# Patient Record
Sex: Male | Born: 1985 | Race: White | Hispanic: No | Marital: Married | Smoking: Former smoker
Health system: Southern US, Community
[De-identification: ages and names within clinical notes are randomized; demographics above are authoritative.]

## PROBLEM LIST (undated history)

## (undated) DIAGNOSIS — J45909 Unspecified asthma, uncomplicated: Secondary | ICD-10-CM

## (undated) DIAGNOSIS — H1010 Acute atopic conjunctivitis, unspecified eye: Secondary | ICD-10-CM

## (undated) DIAGNOSIS — J309 Allergic rhinitis, unspecified: Secondary | ICD-10-CM

## (undated) HISTORY — PX: ANTERIOR CRUCIATE LIGAMENT REPAIR: SHX115

## (undated) HISTORY — PX: WRIST SURGERY: SHX841

## (undated) HISTORY — PX: APPENDECTOMY: SHX54

## (undated) HISTORY — PX: KNEE SURGERY: SHX244

## (undated) HISTORY — DX: Allergic rhinitis, unspecified: J30.9

## (undated) HISTORY — DX: Acute atopic conjunctivitis, unspecified eye: H10.10

## (undated) HISTORY — DX: Unspecified asthma, uncomplicated: J45.909

---

## 2015-01-04 ENCOUNTER — Telehealth: Payer: Self-pay | Admitting: Pediatrics

## 2015-01-04 NOTE — Telephone Encounter (Signed)
Pt wants to change medication from [employer] Respi-click to Pro-Air HFA. Please return call to patient or call in to Pharmacy if okay.

## 2015-01-05 ENCOUNTER — Other Ambulatory Visit: Payer: Self-pay | Admitting: Internal Medicine

## 2015-01-05 MED ORDER — PROAIR HFA 108 (90 BASE) MCG/ACT IN AERS: 2.0000 | INHALATION_SPRAY | RESPIRATORY_TRACT | Status: DC | PRN

## 2015-01-05 NOTE — Telephone Encounter (Signed)
Spoke with pt and let him know that his refill for [employer] HFA had been called in to his pharmacy at [facility] on [address] in [place].

## 2015-01-05 NOTE — Telephone Encounter (Signed)
OK to change to [facility]

## 2015-01-05 NOTE — Telephone Encounter (Signed)
Pt requesting medication change from [employer] Respiclick to [employer] HFA?

## 2015-02-06 ENCOUNTER — Ambulatory Visit (INDEPENDENT_AMBULATORY_CARE_PROVIDER_SITE_OTHER): Payer: BLUE CROSS/BLUE SHIELD | Admitting: Pediatrics

## 2015-02-06 ENCOUNTER — Encounter: Payer: Self-pay | Admitting: Pediatrics

## 2015-02-06 VITALS — BP 118/74 | HR 78 | Temp 97.7°F | Resp 16 | Ht 71.0 in | Wt 195.5 lb

## 2015-02-06 DIAGNOSIS — J4530 Mild persistent asthma, uncomplicated: Secondary | ICD-10-CM | POA: Diagnosis not present

## 2015-02-06 DIAGNOSIS — J3089 Other allergic rhinitis: Secondary | ICD-10-CM | POA: Diagnosis not present

## 2015-02-06 MED ORDER — MONTELUKAST SODIUM 10 MG PO TABS: 10.0000 mg | ORAL_TABLET | Freq: Every day | ORAL | Status: AC

## 2015-02-06 MED ORDER — ALBUTEROL SULFATE HFA 108 (90 BASE) MCG/ACT IN AERS: 2.0000 | INHALATION_SPRAY | RESPIRATORY_TRACT | Status: DC | PRN

## 2015-02-06 NOTE — Progress Notes (Signed)
  [address] [place] [address] Dept: [phone]  FOLLOW UP NOTE  Patient ID: Alexander Barber, male    DOB: 10/10/1985  Age: 29 y.o. MRN: [id number] Date of Office Visit: 02/06/2015  Assessment Chief Complaint: Follow-up  HPI Alexander Barber presents for follow-up of his asthma and allergies. He has noted aggravation of his symptoms on exposure to dog and also mowing the lawn and raking leaves. He has had a flu vaccination. He is very allergic to ragweed ,grass pollens, tree pollens, molds and dust mite. He has a slight reaction to dog   Current medications are Pro-air 2 puffs every 4 hours if needed, cetirizine 10 mg once a day, fluticasone 2 sprays per nostril once a day and montelukast 10 mg once a day  Drug Allergies:  No Known Allergies  Physical Exam: BP 118/74 mmHg  Pulse 78  Temp(Src) 97.7 F (36.5 C) (Oral)  Resp 16  Ht 5\' 11"  (1.803 m)  Wt 195 lb 8.8 oz (88.7 kg)  BMI 27.29 kg/m2   Physical Exam  Constitutional: He is oriented to person, place, and time. He appears well-developed and well-nourished.  HENT:  Eyes normal. Ears normal. Nose moderate swelling of nasal turbinates. Pharynx normal.  Neck: Neck supple.  Cardiovascular:  S1 and S2 normal no murmurs  Pulmonary/Chest:  Clear to percussion and auscultation  Lymphadenopathy:    He has no cervical adenopathy.  Neurological: He is alert and oriented to person, place, and time.  Psychiatric: He has a normal mood and affect. His behavior is normal. Judgment and thought content normal.    Diagnostics:  FVC 6.37 L FEV1 4.09 L predicted FVC 5.61 L predicted FEV1 4.55 L-this shows a mild reduction in the FEV1 percent  Assessment and Plan: 1. Mild persistent asthma, uncomplicated   2. Other allergic rhinitis     Meds ordered this encounter  Medications  . montelukast (SINGULAIR) 10 MG tablet    Sig: Take 1 tablet (10 mg total) by mouth at bedtime.    Dispense:  30 tablet    Refill:  5  .  albuterol (PROAIR HFA) 108 (90 BASE) MCG/ACT inhaler    Sig: Inhale 2 puffs into the lungs every 4 (four) hours as needed for wheezing or shortness of breath.    Dispense:  1 Inhaler    Refill:  3    Patient Instructions  Continue on the treatment plan outlined above Because of the severity of his symptoms from the ragweed allergy, I added prednisone 20 mg twice a day for 3 days, 20 mg on day 4, 10 mg on day 5.  I gave him information regarding allergy injections. He is a very allergic individual. He will let me know if he wants to go ahead and start on allergy injections    Return in about 1 year (around 02/06/2016).    Thank you for the opportunity to care for this patient.  Please do not hesitate to contact me with questions.  Tonette BihariJ. A. Bardelas, M.D.  [facility] [address] [place] [address] [phone]

## 2015-02-06 NOTE — Patient Instructions (Addendum)
Continue on the treatment plan outlined above Because of the severity of his symptoms from the ragweed allergy, I added prednisone 20 mg twice a day for 3 days, 20 mg on day 4, 10 mg on day 5.  I gave him information regarding allergy injections. He is a very allergic individual. He will let me know if he wants to go ahead and start on allergy injections

## 2015-05-10 ENCOUNTER — Other Ambulatory Visit: Payer: Self-pay | Admitting: Allergy

## 2015-05-10 MED ORDER — ALBUTEROL SULFATE HFA 108 (90 BASE) MCG/ACT IN AERS: 2.0000 | INHALATION_SPRAY | RESPIRATORY_TRACT | Status: DC | PRN

## 2015-05-11 ENCOUNTER — Other Ambulatory Visit: Payer: Self-pay

## 2015-05-11 MED ORDER — ALBUTEROL SULFATE HFA 108 (90 BASE) MCG/ACT IN AERS: 2.0000 | INHALATION_SPRAY | RESPIRATORY_TRACT | Status: DC | PRN

## 2015-07-19 ENCOUNTER — Other Ambulatory Visit: Payer: Self-pay | Admitting: Pediatrics

## 2015-07-19 NOTE — Telephone Encounter (Signed)
rx sent for proair hfa with 1 refill

## 2016-06-23 ENCOUNTER — Other Ambulatory Visit: Payer: Self-pay | Admitting: Allergy

## 2016-07-03 ENCOUNTER — Emergency Department (INDEPENDENT_AMBULATORY_CARE_PROVIDER_SITE_OTHER)
Admission: EM | Admit: 2016-07-03 | Discharge: 2016-07-03 | Disposition: A | Discharge status: Home / Self Care | Payer: BLUE CROSS/BLUE SHIELD | Source: Home / Self Care | Attending: Family Medicine | Admitting: Family Medicine

## 2016-07-03 ENCOUNTER — Encounter: Payer: Self-pay | Admitting: Emergency Medicine

## 2016-07-03 ENCOUNTER — Emergency Department (INDEPENDENT_AMBULATORY_CARE_PROVIDER_SITE_OTHER): Payer: BLUE CROSS/BLUE SHIELD

## 2016-07-03 DIAGNOSIS — M25442 Effusion, left hand: Secondary | ICD-10-CM

## 2016-07-03 DIAGNOSIS — S63615A Unspecified sprain of left ring finger, initial encounter: Secondary | ICD-10-CM | POA: Diagnosis not present

## 2016-07-03 MED ORDER — IBUPROFEN 600 MG PO TABS: 600.0000 mg | ORAL_TABLET | Freq: Once | ORAL | Status: DC

## 2016-07-03 NOTE — ED Triage Notes (Signed)
Patient hurt left ring finger while golfing earlier today. No OTC or icing.

## 2016-07-03 NOTE — ED Provider Notes (Signed)
CSN: [id number]     Arrival date & time 07/03/16  1715 History   First MD Initiated Contact with Patient 07/03/16 1745     Chief Complaint  Patient presents with  . Hand Pain    left ring finger   (Consider location/radiation/quality/duration/timing/severity/associated sxs/prior Treatment) HPI Alexander Barber is a 31 y.o. male presenting to UC with c/o Left ring finger pain that started earlier today while golfing.  Pt notes he had a poor swing, hitting more of the ground than the ball causing his club to force his Left ring finger laterally. Pt notes he felt a "pop" in his finger resulting in pain, swelling, and slight decreased ROM at the PIP joint in that finger. He is Right hand dominant. Pt was given ibuprofen in triage. Pain is  5/10, aching. Hx of "hairline fracture" in same finger but healed w/o complications within 2-3 weeks.    Past Medical History:  Diagnosis Date  . Allergic conjunctivitis and rhinitis   . Asthma    Past Surgical History:  Procedure Laterality Date  . ANTERIOR CRUCIATE LIGAMENT REPAIR Left   . APPENDECTOMY    . KNEE SURGERY Bilateral   . WRIST SURGERY Bilateral    Family History  Problem Relation Age of Onset  . Allergic rhinitis Sister   . Asthma Sister   . Allergic rhinitis Brother   . Eczema Brother   . Urticaria Brother    Social History  Substance Use Topics  . Smoking status: Former Games developer  . Smokeless tobacco: Never Used  . Alcohol use 1.8 oz/week    3 Cans of beer per week    Review of Systems  Musculoskeletal: Positive for arthralgias and joint swelling. Negative for myalgias.  Skin: Negative for color change and wound.  Neurological: Positive for weakness. Negative for numbness.    Allergies  Patient has no known allergies.  Home Medications   Prior to Admission medications   Medication Sig Start Date End Date Taking? Authorizing Provider  Albuterol Sulfate (PROAIR RESPICLICK) 108 (90 BASE) MCG/ACT AEPB Inhale 2 puffs into  the lungs every 4 (four) hours as needed. FOR COUGH OR WHEEZE    Historical Provider, MD  cetirizine (ZYRTEC) 10 MG tablet Take 10 mg by mouth daily.    Historical Provider, MD  fluticasone (FLONASE) 50 MCG/ACT nasal spray Place 2 sprays into both nostrils daily.    Historical Provider, MD  montelukast (SINGULAIR) 10 MG tablet Take 1 tablet (10 mg total) by mouth at bedtime. 02/06/15   Fletcher Anon, MD  PROAIR HFA 108 (90 Base) MCG/ACT inhaler INHALE 2 PUFFS INTO THE LUNGS EVERY 4 (FOUR) HOURS AS NEEDED FOR WHEEZING OR SHORTNESS OF BREATH. 07/19/15   Fletcher Anon, MD  tetrahydrozoline-zinc (VISINE-AC) 0.05-0.25 % ophthalmic solution Place 1 drop into both eyes 3 (three) times daily as needed.    Historical Provider, MD   Meds Ordered and Administered this Visit   Medications  ibuprofen (ADVIL,MOTRIN) tablet 600 mg (not administered)    BP 110/76 (BP Location: Left Arm)   Pulse 81   Temp 98.1 F (36.7 C) (Oral)   Resp 16   Ht 5\' 11"  (1.803 m)   Wt 190 lb (86.2 kg)   SpO2 96%   BMI 26.50 kg/m  No data found.   Physical Exam  Constitutional: He is oriented to person, place, and time. He appears well-developed and well-nourished.  HENT:  Head: Normocephalic and atraumatic.  Eyes: EOM are normal.  Neck: Normal range  of motion.  Cardiovascular: Normal rate.   Pulmonary/Chest: Effort normal.  Musculoskeletal: He exhibits edema and tenderness.  Left 4th finger: mild edema to PIP with tenderness. Slight decreased flexion and extension.   Neurological: He is alert and oriented to person, place, and time.  Skin: Skin is warm and dry. Capillary refill takes less than 2 seconds. No erythema.  Left fourth finger: skin in tact. No ecchymosis or erythema.   Psychiatric: He has a normal mood and affect. His behavior is normal.  Nursing note and vitals reviewed.   Urgent Care Course     Procedures (including critical care time)  Labs Review Labs Reviewed - No data to  display  Imaging Review Dg Finger Ring Left  Result Date: 07/03/2016 CLINICAL DATA:  Golf injury with swelling at the PIP joint. EXAM: LEFT RING FINGER 2+V COMPARISON:  None. FINDINGS: Soft tissue swelling of the PIP joint. No acute fracture or malalignment of the left ring finger. No radiopaque foreign bodies. IMPRESSION: Soft tissue swelling at the PIP joint of the left ring finger without acute fracture or malalignment. Electronically Signed   By: Tollie Ethavid  Kwon M.D.   On: 07/03/2016 18:02      MDM   1. Sprain of left ring finger, initial encounter    PMS in tact. Swelling and tenderness at PIP of Left ring finger.    Plain films show swelling at PIP but no definite fracture  Will splint for comfort Encouraged to wear splint for 1 week, f/u with Sports Medicine if not improving as he may need repeat imaging.     Junius Finnerrin O'Malley, PA-C 07/03/16 1814

## 2018-10-19 IMAGING — DX DG FINGER RING 2+V*L*
3 series · 3 of 3 positions shown · non-contrast
Comparison: None.

CLINICAL DATA: Golf injury with swelling at the PIP joint.

EXAM:
LEFT RING FINGER 2+V

[finger ap]
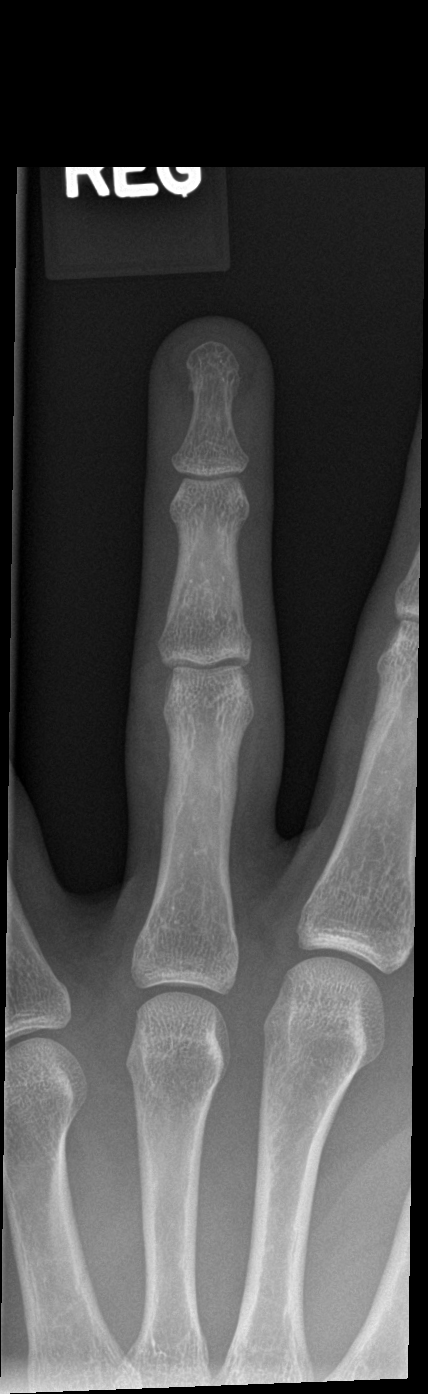

[finger obl]
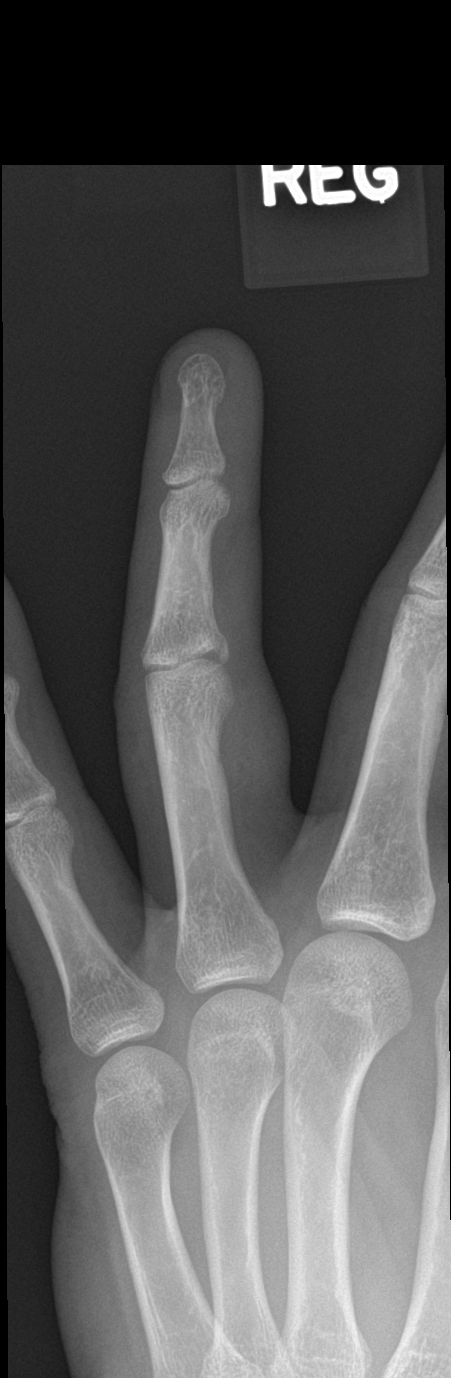

[finger lat]
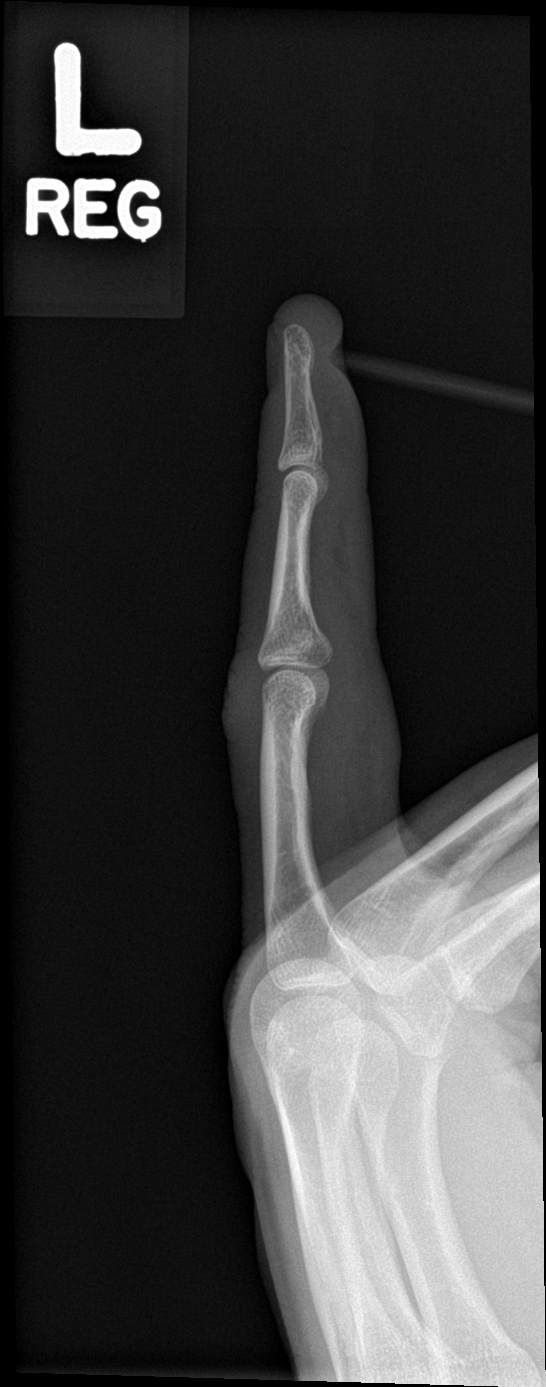

[3 of 3 positions shown; findings below may reference images not displayed]

FINDINGS: Soft tissue swelling of the PIP joint. No acute fracture or
malalignment of the left ring finger. No radiopaque foreign bodies.
IMPRESSION: Soft tissue swelling at the PIP joint of the left ring finger
without acute fracture or malalignment.
